# Patient Record
Sex: Male | Born: 1995 | Race: Black or African American | Hispanic: No | Marital: Single | Smoking: Never smoker
Health system: Southern US, Community
[De-identification: ages and names within clinical notes are randomized; demographics above are authoritative.]

---

## 2015-05-10 ENCOUNTER — Emergency Department (HOSPITAL_COMMUNITY)
Admission: EM | Admit: 2015-05-10 | Discharge: 2015-05-11 | Disposition: A | Discharge status: Home / Self Care | Payer: Medicaid Other | Attending: Emergency Medicine | Admitting: Emergency Medicine

## 2015-05-10 ENCOUNTER — Emergency Department (HOSPITAL_COMMUNITY): Payer: Medicaid Other

## 2015-05-10 ENCOUNTER — Encounter (HOSPITAL_COMMUNITY): Payer: Self-pay | Admitting: Emergency Medicine

## 2015-05-10 DIAGNOSIS — Y9351 Activity, roller skating (inline) and skateboarding: Secondary | ICD-10-CM | POA: Diagnosis not present

## 2015-05-10 DIAGNOSIS — Y9289 Other specified places as the place of occurrence of the external cause: Secondary | ICD-10-CM | POA: Insufficient documentation

## 2015-05-10 DIAGNOSIS — Y998 Other external cause status: Secondary | ICD-10-CM | POA: Diagnosis not present

## 2015-05-10 DIAGNOSIS — S93402A Sprain of unspecified ligament of left ankle, initial encounter: Secondary | ICD-10-CM | POA: Insufficient documentation

## 2015-05-10 DIAGNOSIS — S99912A Unspecified injury of left ankle, initial encounter: Secondary | ICD-10-CM | POA: Diagnosis present

## 2015-05-10 MED ORDER — IBUPROFEN 800 MG PO TABS: 800.0000 mg | ORAL_TABLET | Freq: Three times a day (TID) | ORAL | Status: AC

## 2015-05-10 MED ORDER — FENTANYL CITRATE (PF) 100 MCG/2ML IJ SOLN: 100.0000 ug | Freq: Once | INTRAMUSCULAR | Status: DC

## 2015-05-10 MED ORDER — FENTANYL CITRATE (PF) 100 MCG/2ML IJ SOLN
100.0000 ug | Freq: Once | INTRAMUSCULAR | Status: AC
  Administered 2015-05-10: 100 ug via INTRAVENOUS
  Filled 2015-05-10: qty 2

## 2015-05-10 NOTE — ED Notes (Signed)
Pt presents via EMS after skateboarding down 10+ stairs with friends.  He has an obvious deformity and severe swelling to the left ankle as well as a small abrasion on the outer aspect of the ankle due to being scraped on impact.  He denies pain at this time and states that there are no other injuries.  Very tender to palpation.

## 2015-05-10 NOTE — Discharge Instructions (Signed)

## 2015-05-10 NOTE — ED Notes (Signed)
Bed: XB28 Expected date:  Expected time:  Means of arrival:  Comments: 44M skateboarding fall/ obvious def to ankle

## 2015-05-10 NOTE — ED Provider Notes (Signed)
CSN: [id number]     Arrival date & time 05/10/15  2128 History   First MD Initiated Contact with Patient 05/10/15 2142     Chief Complaint  Patient presents with  . Ankle Injury     (Consider location/radiation/quality/duration/timing/severity/associated sxs/prior Treatment) HPI Comments: The patient is an 19 year old male who states that "I thought I could still skateboard". He decided to jump down 10 stairs on his skateboard, missed his skateboard on landing striking his left ankle and causing acute onset of pain and deformity. He was unable to ambulate afterwards but decided to go to a restaurant and try to get some food before his girlfriend forced him to come to the hospital to get evaluated. Because he could not walk he was transported by ambulance, ice pack added for deformity. Denies other injuries including numbness weakness back pain neck pain headache and there was no head injuries.  Patient is a 19 y.o. male presenting with lower extremity injury. The history is provided by the patient.  Ankle Injury    History reviewed. No pertinent past medical history. History reviewed. No pertinent past surgical history. History reviewed. No pertinent family history. Social History  Substance Use Topics  . Smoking status: Never Smoker   . Smokeless tobacco: None  . Alcohol Use: Yes     Comment: socially    Review of Systems  All other systems reviewed and are negative.     Allergies  Review of patient's allergies indicates no known allergies.  Home Medications   Prior to Admission medications   Medication Sig Start Date End Date Taking? Authorizing Provider  ibuprofen (ADVIL,MOTRIN) 800 MG tablet Take 1 tablet (800 mg total) by mouth 3 (three) times daily. 05/10/15   Eber Hong, MD   BP 135/92 mmHg  Pulse 70  Temp(Src) 98.8 F (37.1 C) (Oral)  Resp 16  Ht 6\' 1"  (1.854 m)  Wt 182 lb (82.555 kg)  BMI 24.02 kg/m2  SpO2 100% Physical Exam  Constitutional: He appears  well-developed and well-nourished. No distress.  HENT:  Head: Normocephalic and atraumatic.  Mouth/Throat: Oropharynx is clear and moist. No oropharyngeal exudate.  Eyes: Conjunctivae and EOM are normal. Pupils are equal, round, and reactive to light. Right eye exhibits no discharge. Left eye exhibits no discharge. No scleral icterus.  Neck: Normal range of motion. Neck supple. No JVD present. No thyromegaly present.  Cardiovascular: Normal rate, regular rhythm, normal heart sounds and intact distal pulses.  Exam reveals no gallop and no friction rub.   No murmur heard. Pulmonary/Chest: Effort normal and breath sounds normal. No respiratory distress. He has no wheezes. He has no rales.  Abdominal: Soft. Bowel sounds are normal. He exhibits no distension and no mass. There is no tenderness.  Musculoskeletal: He exhibits edema and tenderness.  Decreased range of motion of the left ankle, there is deformity especially laterally where there is tenderness over the lateral malleolus.  Lymphadenopathy:    He has no cervical adenopathy.  Neurological: He is alert. Coordination normal.  Skin: Skin is warm and dry. No rash noted. No erythema.  Psychiatric: He has a normal mood and affect. His behavior is normal.  Nursing note and vitals reviewed.   ED Course  Procedures (including critical care time) Labs Review Labs Reviewed - No data to display  Imaging Review Dg Ankle Complete Left  05/10/2015   CLINICAL DATA:  Injured skateboarding down 10+ stairs, obvious deformity and severe swelling LEFT ankle, tender to palpation, initial encounter  EXAM:  LEFT ANKLE COMPLETE - 3+ VIEW  COMPARISON:  None  FINDINGS: Soft tissue swelling greater anteriorly and laterally.  Osseous mineralization normal.  Joint spaces preserved.  No definite acute fracture, dislocation, or bone destruction.  IMPRESSION: Significant soft tissue swelling without acute osseous abnormalities.   Electronically Signed   By: Ulyses Southward  M.D.   On: 05/10/2015 22:58   Dg Foot Complete Left  05/10/2015   CLINICAL DATA:  Left foot and ankle pain after skateboarding injury. Deformity.  EXAM: LEFT FOOT - COMPLETE 3+ VIEW  COMPARISON:  None.  FINDINGS: No fracture or dislocation. The alignment and joint spaces are maintained. Small os peroneal. There is lateral soft tissue edema. No radiopaque foreign body.  IMPRESSION: Lateral soft tissue edema. No acute fracture or dislocation of the foot.   Electronically Signed   By: Rubye Oaks M.D.   On: 05/10/2015 22:59   I have personally reviewed and evaluated these images and lab results as part of my medical decision-making.    MDM   Final diagnoses:  Ankle sprain, left, initial encounter    Focal injury to left ankle, normal pulses at the foot, normal capillary refill, suspect fibular injury. Imaging ordered, pain medication ordered. There is a slight abrasion over the skin, this does not correlate with any underlying bony structure, he suspects this is an abrasion from the fall.  Imaging reveals no signs of fracture, I have personally looked at the x-ray, there is no sign of fracture  I have personally viewed and interpreted the imaging and agree with radiologist interpretation.  Meds given in ED:  Medications  fentaNYL (SUBLIMAZE) injection 100 mcg (100 mcg Intravenous Given 05/10/15 2202)    New Prescriptions   IBUPROFEN (ADVIL,MOTRIN) 800 MG TABLET    Take 1 tablet (800 mg total) by mouth 3 (three) times daily.      Eber Hong, MD 05/10/15 2330

## 2017-01-15 IMAGING — CR DG ANKLE COMPLETE 3+V*L*
3 series · 3 of 3 positions shown · non-contrast
Comparison: None

CLINICAL DATA: Injured skateboarding down 10+ stairs, obvious
deformity and severe swelling LEFT ankle, tender to palpation,
initial encounter

EXAM:
LEFT ANKLE COMPLETE - 3+ VIEW

[x ankle ap left]
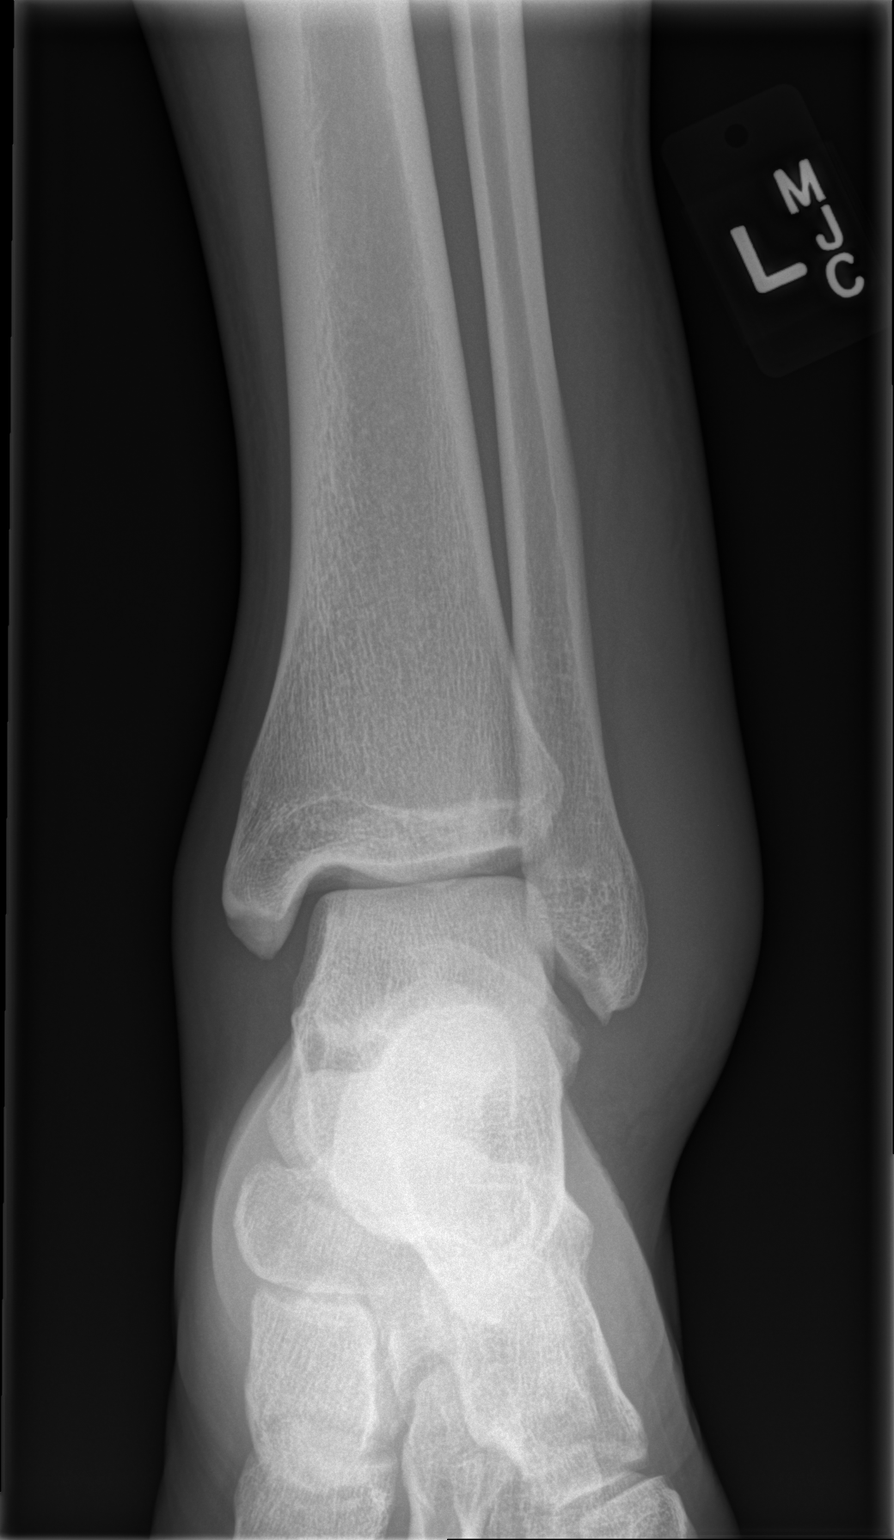

[x ankle obl left]
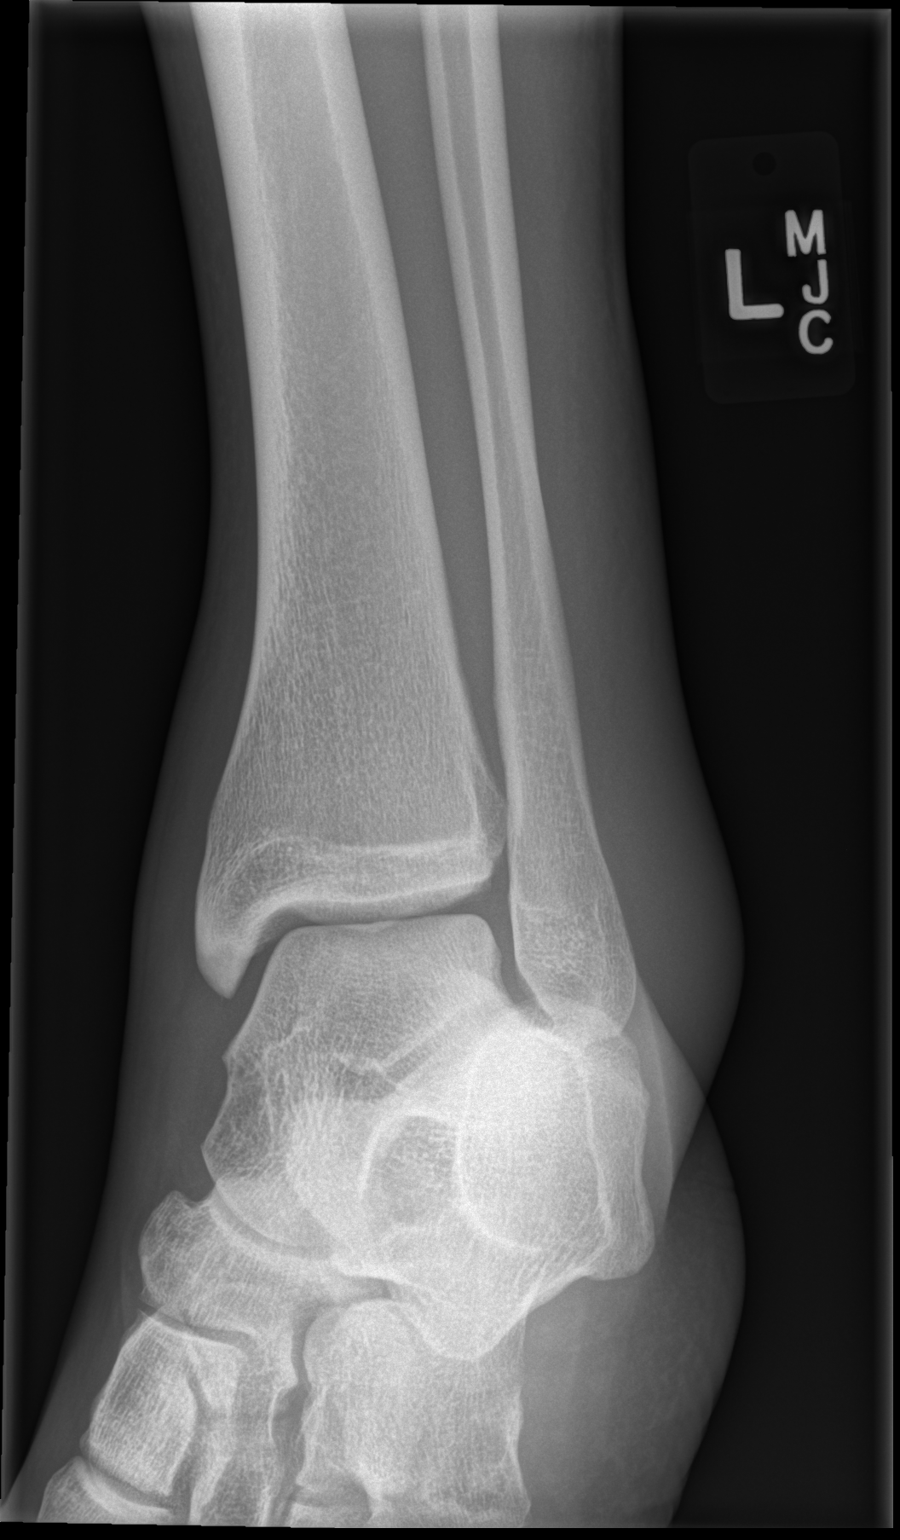

[x ankle lat left]
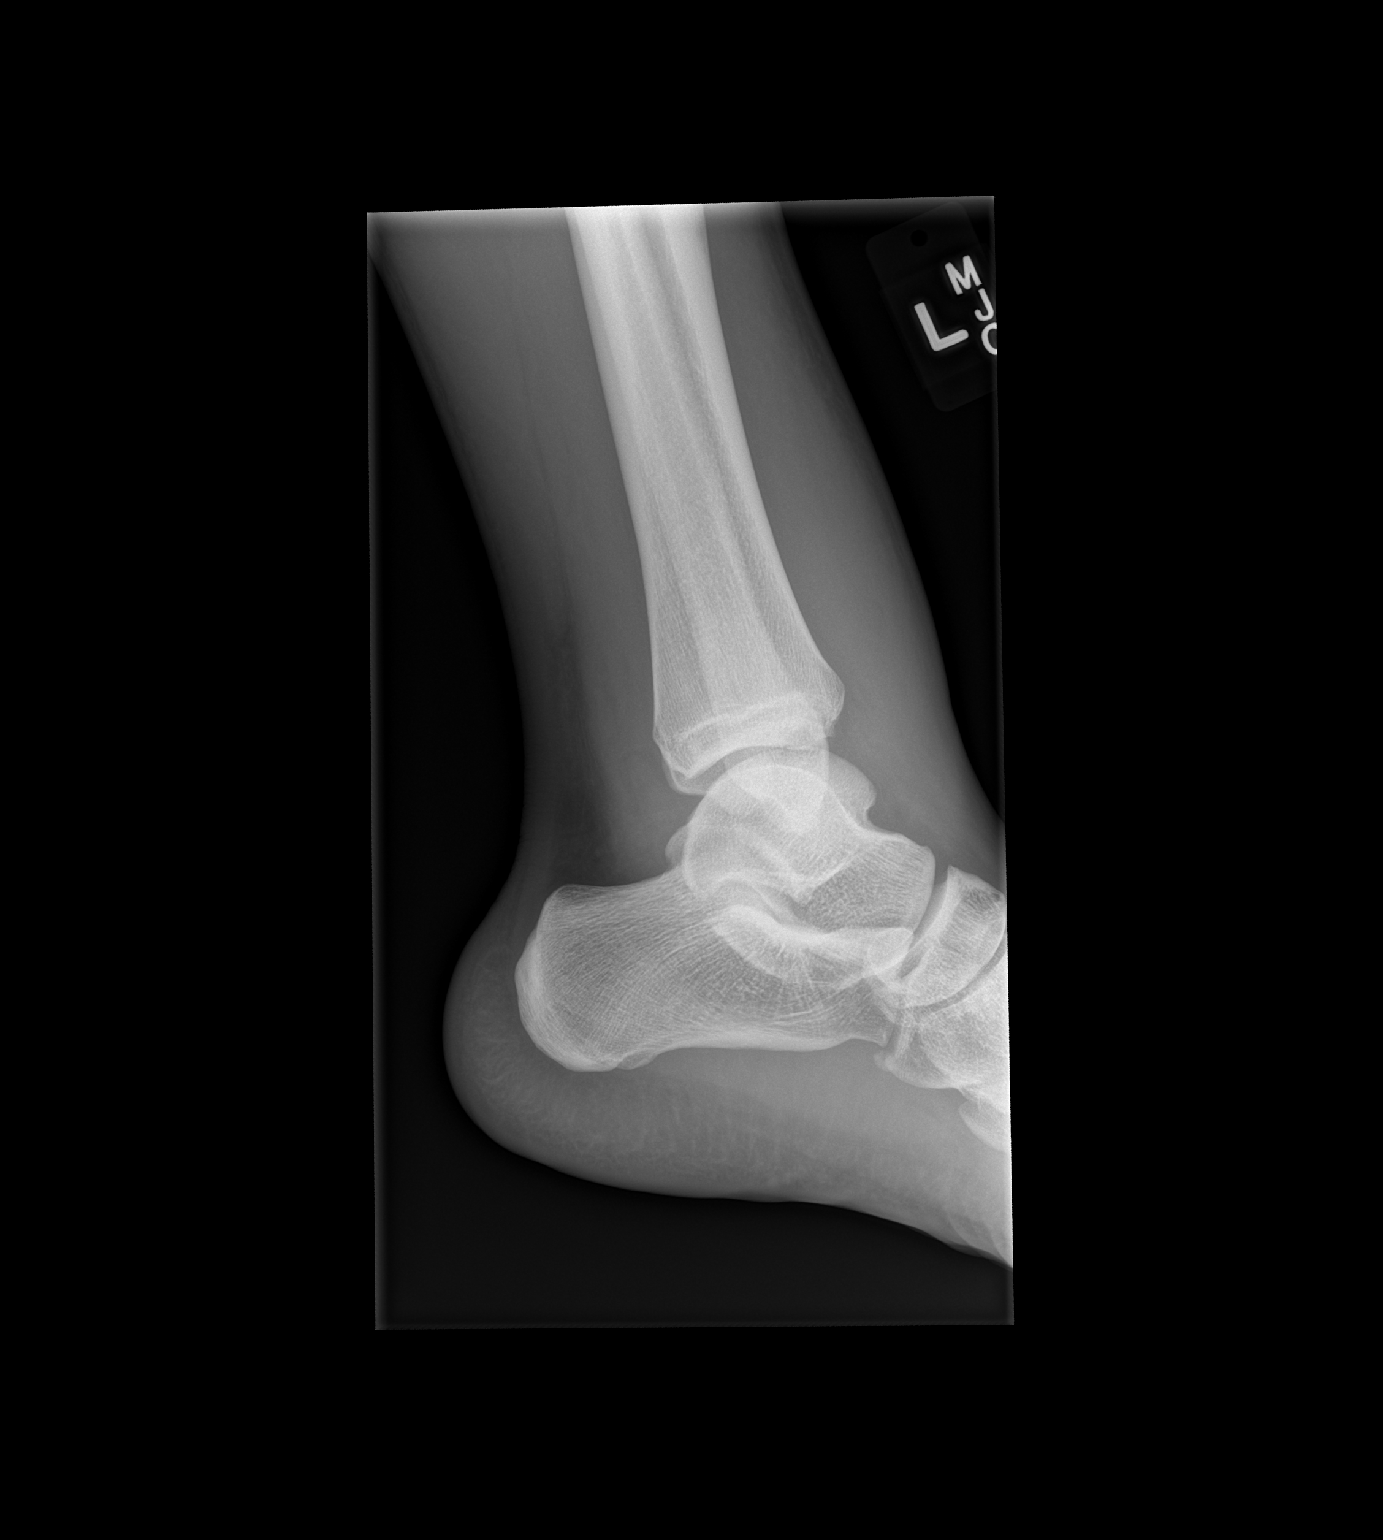

[3 of 3 positions shown; findings below may reference images not displayed]

FINDINGS: Soft tissue swelling greater anteriorly and laterally.

Osseous mineralization normal.

Joint spaces preserved.

No definite acute fracture, dislocation, or bone destruction.
IMPRESSION: Significant soft tissue swelling without acute osseous
abnormalities.

## 2017-01-15 IMAGING — CR DG FOOT COMPLETE 3+V*L*
3 series · 3 of 3 positions shown · non-contrast
Comparison: None.

CLINICAL DATA: Left foot and ankle pain after skateboarding injury.
Deformity.

EXAM:
LEFT FOOT - COMPLETE 3+ VIEW

[x foot lat left]
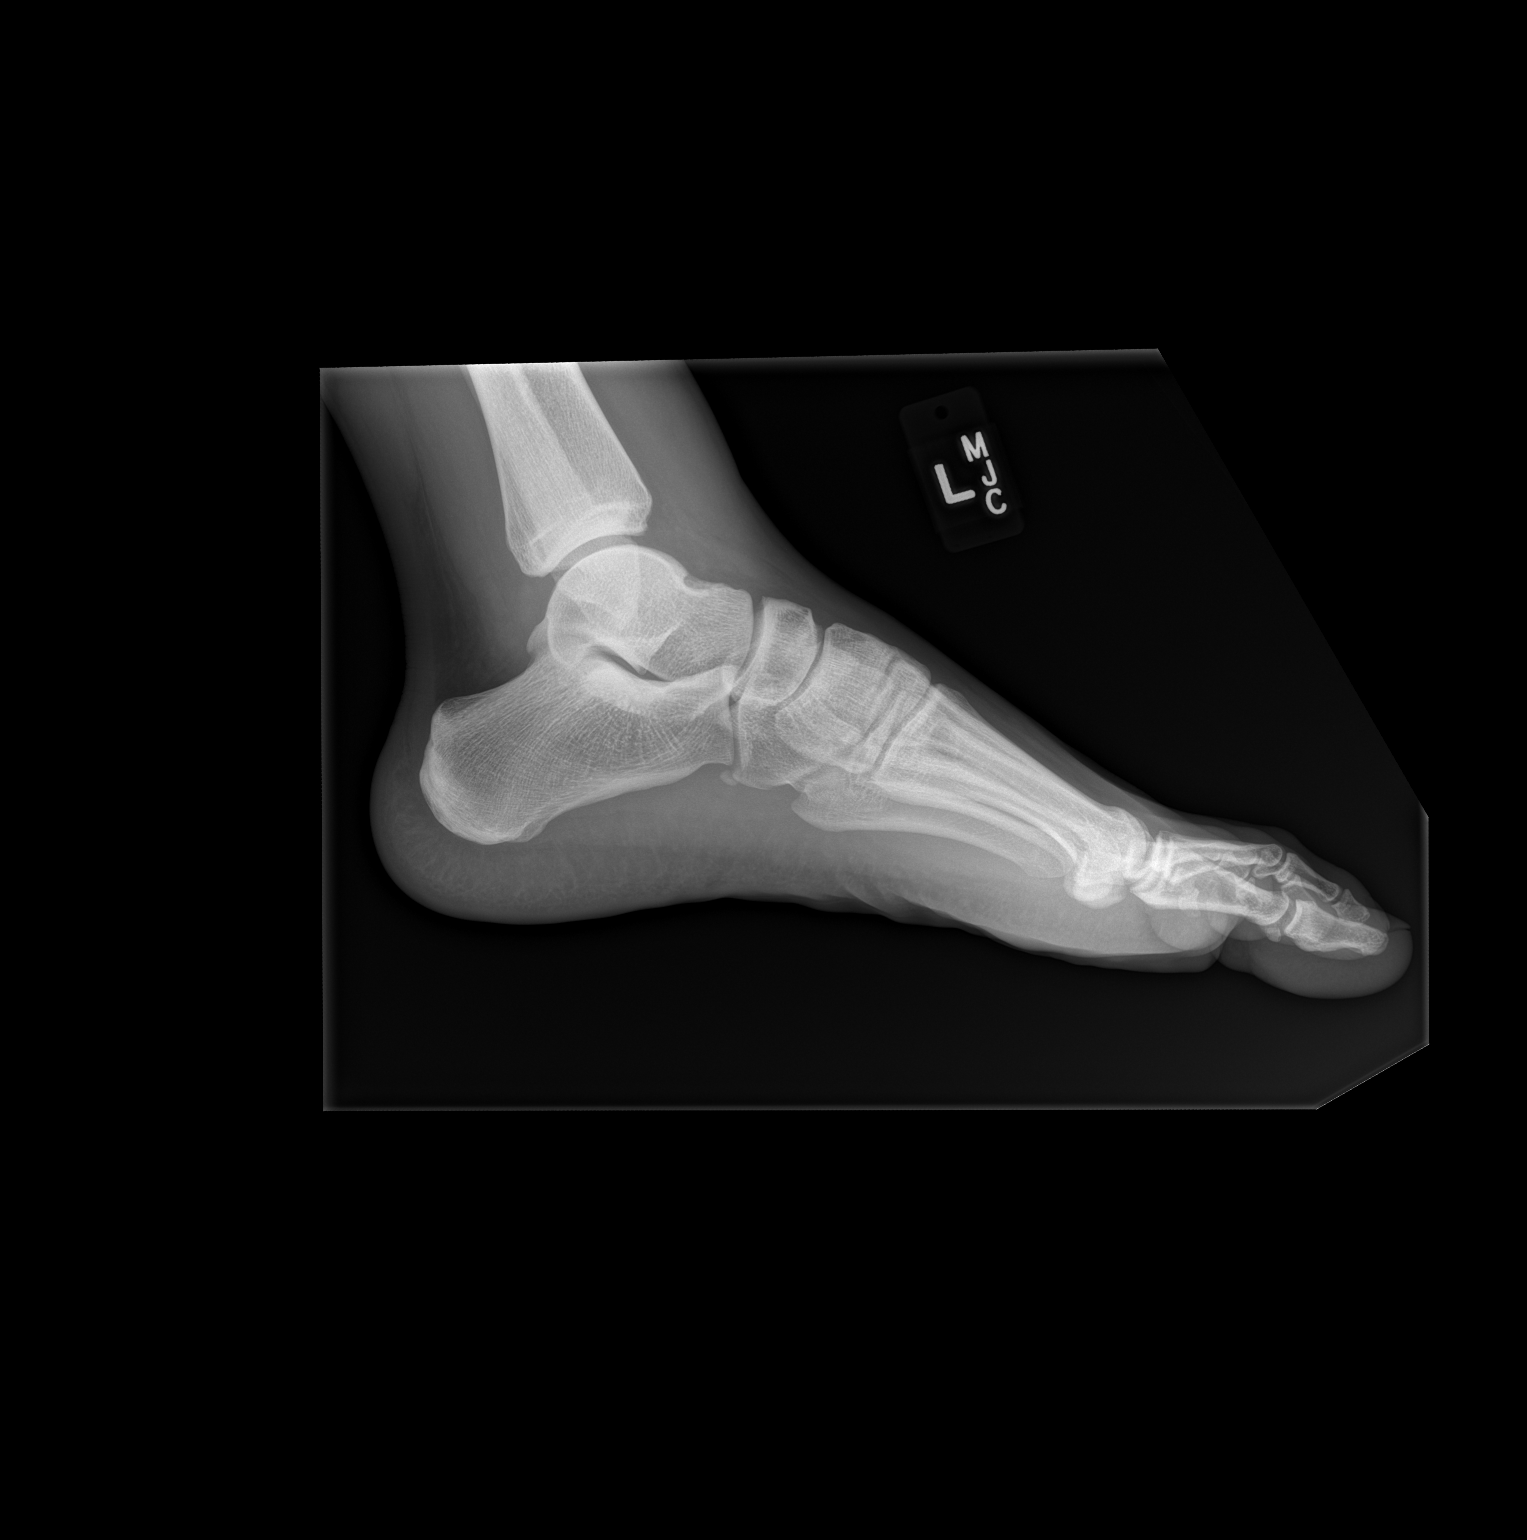

[x foot ap left]
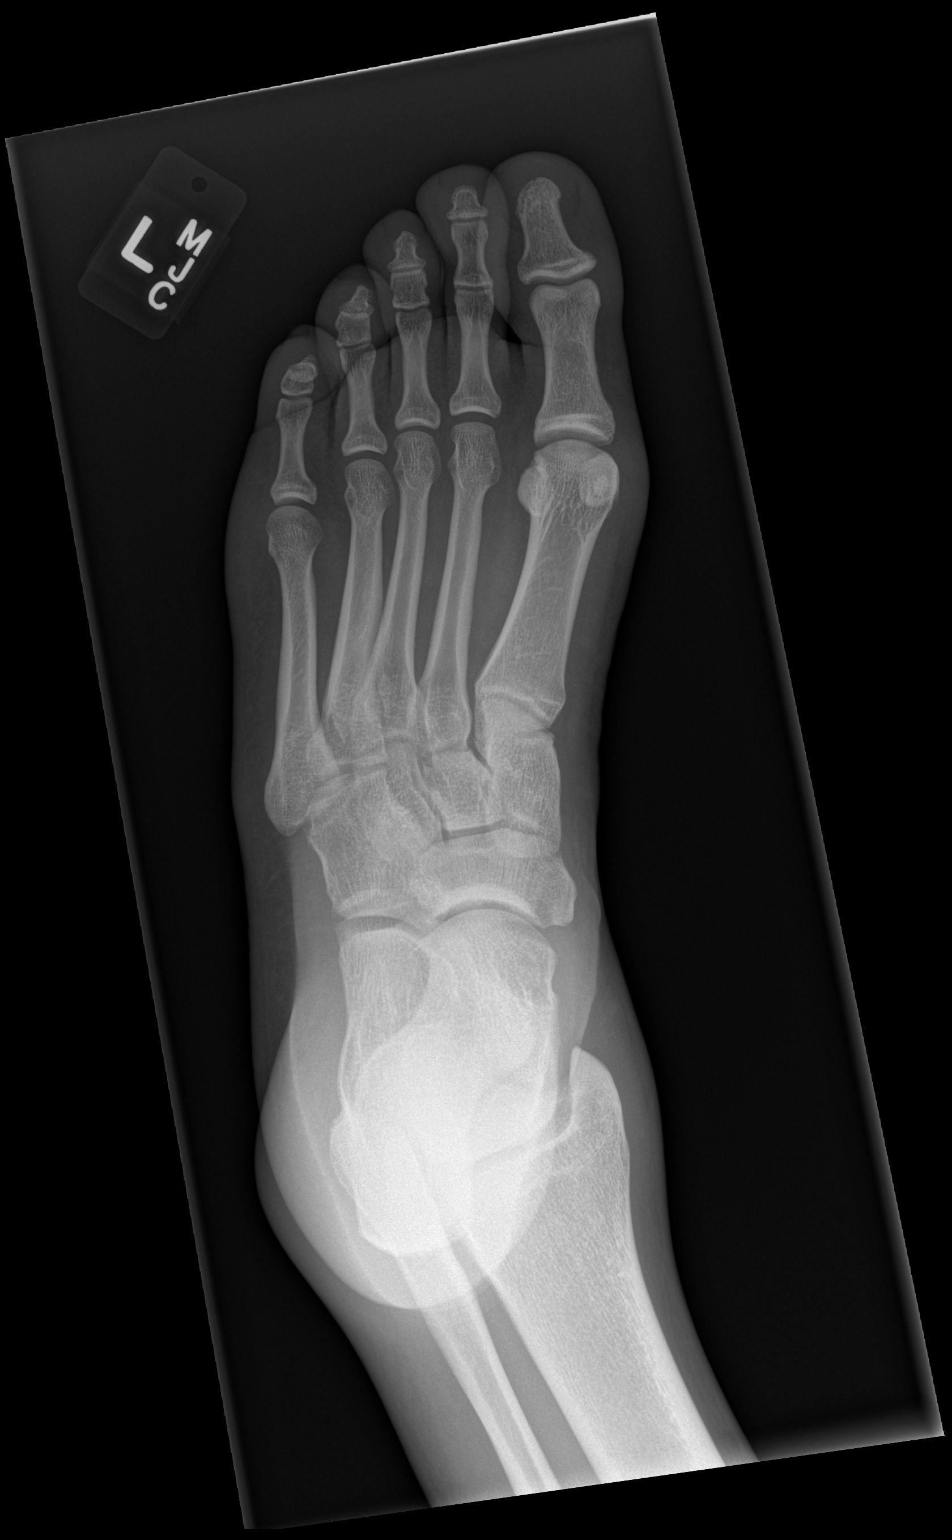

[x foot obl left]
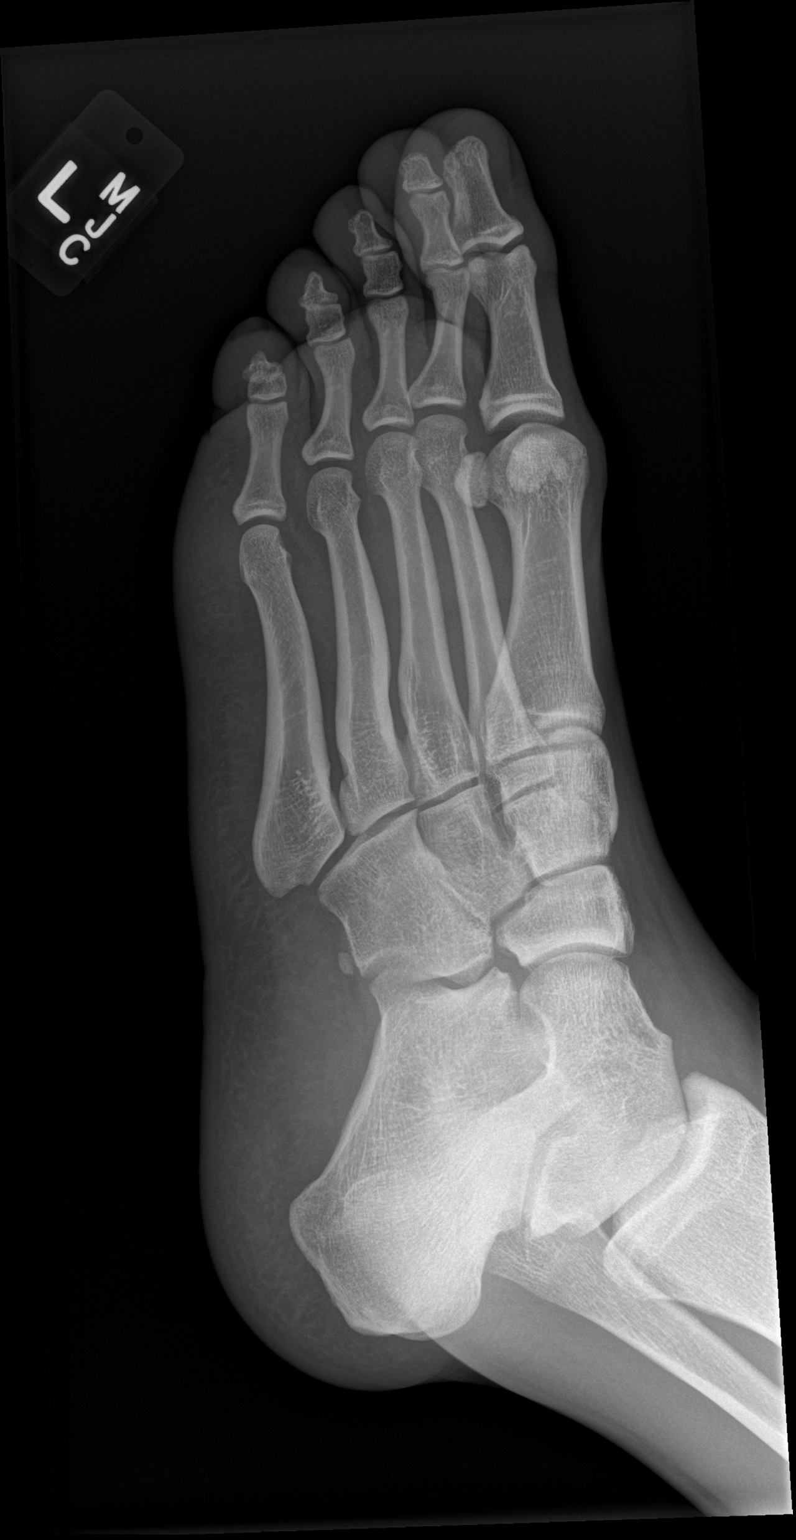

[3 of 3 positions shown; findings below may reference images not displayed]

FINDINGS: No fracture or dislocation. The alignment and joint spaces are
maintained. Small os peroneal. There is lateral soft tissue edema.
No radiopaque foreign body.
IMPRESSION: Lateral soft tissue edema. No acute fracture or dislocation of the
foot.

## 2017-09-24 ENCOUNTER — Encounter (HOSPITAL_COMMUNITY): Payer: Self-pay

## 2017-09-24 ENCOUNTER — Emergency Department (HOSPITAL_COMMUNITY): Payer: Self-pay

## 2017-09-24 ENCOUNTER — Emergency Department (HOSPITAL_COMMUNITY)
Admission: EM | Admit: 2017-09-24 | Discharge: 2017-09-24 | Disposition: A | Discharge status: Home / Self Care | Payer: Self-pay | Attending: Emergency Medicine | Admitting: Emergency Medicine

## 2017-09-24 ENCOUNTER — Encounter (HOSPITAL_COMMUNITY): Payer: Self-pay | Admitting: Emergency Medicine

## 2017-09-24 DIAGNOSIS — M25469 Effusion, unspecified knee: Secondary | ICD-10-CM

## 2017-09-24 DIAGNOSIS — M25562 Pain in left knee: Secondary | ICD-10-CM | POA: Insufficient documentation

## 2017-09-24 DIAGNOSIS — M25462 Effusion, left knee: Secondary | ICD-10-CM | POA: Insufficient documentation

## 2017-09-24 MED ORDER — NAPROXEN 500 MG PO TABS: 500.0000 mg | ORAL_TABLET | Freq: Two times a day (BID) | ORAL | 0 refills | Status: AC

## 2017-09-24 NOTE — ED Provider Notes (Signed)
[facility]-EMERGENCY DEPT Provider Note   CSN: [id number] Arrival date & time: 09/24/17  0243     History   Chief Complaint Chief Complaint  Patient presents with  . Knee Pain    HPI Evan Hardin is a 22 y.o. male.  The history is provided by the patient and medical records.  Knee Pain       22 year old male presenting to the ED with 1 week of left knee pain.  He does not recall any specific injury other than accidentally hitting his knee on a box beneath his desk at work.  States he does have a lot of pain, especially when walking.  Throughout the day knee seems to swell up, but improves once he is at home and resting.  He denies any numbness or weakness of his left leg.  No prior left knee injuries or surgeries.  Has been applying ice at home to help with swelling.  History reviewed. No pertinent past medical history.  There are no active problems to display for this patient.   History reviewed. No pertinent surgical history.     Home Medications    Prior to Admission medications   Not on File    Family History History reviewed. No pertinent family history.  Social History Social History   Tobacco Use  . Smoking status: Never Smoker  . Smokeless tobacco: Never Used  Substance Use Topics  . Alcohol use: No    Frequency: Never  . Drug use: No     Allergies   Patient has no allergy information on record.   Review of Systems Review of Systems  Musculoskeletal: Positive for arthralgias.  All other systems reviewed and are negative.    Physical Exam Updated Vital Signs BP (!) 148/88 (BP Location: Right Arm)   Pulse 64   Temp 98.3 F (36.8 C) (Oral)   Resp 16   SpO2 99%   Physical Exam  Constitutional: He is oriented to person, place, and time. He appears well-developed and well-nourished.  HENT:  Head: Normocephalic and atraumatic.  Mouth/Throat: Oropharynx is clear and moist.  Eyes: Conjunctivae and EOM  are normal. Pupils are equal, round, and reactive to light.  Neck: Normal range of motion.  Cardiovascular: Normal rate, regular rhythm and normal heart sounds.  Pulmonary/Chest: Effort normal and breath sounds normal.  Abdominal: Soft. Bowel sounds are normal.  Musculoskeletal: Normal range of motion.       Legs: Left knee with noted effusion and swelling; tenderness along the lateral edge of the superior patella; there does appear to be a small defect in this area compared with right leg; patella tracks normally during ROM testing; normal contractions of quad and gastroc muscles; DP pulse intact, moving toes normally, ambulatory with limping gait favoring left leg  Neurological: He is alert and oriented to person, place, and time.  Skin: Skin is warm and dry.  Psychiatric: He has a normal mood and affect.  Nursing note and vitals reviewed.    ED Treatments / Results  Labs (all labs ordered are listed, but only abnormal results are displayed) Labs Reviewed - No data to display  EKG  EKG Interpretation None       Radiology Dg Knee Complete 4 Views Left  Result Date: 09/24/2017 CLINICAL DATA:  22 year old male with left knee pain no known injury. EXAM: LEFT KNEE - COMPLETE 4+ VIEW COMPARISON:  None. FINDINGS: There is focal indentation of the superolateral cortex of the patella with small  adjacent bony fragments, age indeterminate, likely chronic. Clinical correlation is recommended. No definite acute fracture. There is no dislocation. Trace suprapatellar effusion may be present. The soft tissues are unremarkable. IMPRESSION: 1. No definite acute fracture or dislocation. Focal indentation of the superolateral patella, likely chronic. Clinical correlation is recommended. 2. Probable trace suprapatellar effusion. Electronically Signed   By: Elgie CollardArash  Radparvar M.D.   On: 09/24/2017 04:18    Procedures Procedures (including critical care time)  Medications Ordered in ED Medications - No  data to display   Initial Impression / Assessment and Plan / ED Course  I have reviewed the triage vital signs and the nursing notes.  Pertinent labs & imaging results that were available during my care of the patient were reviewed by me and considered in my medical decision making (see chart for details).  22 year old male here with left knee pain.  Only reported injury is excellently striking his knee against a box beneath his desk at work.  Has been limping for about the past week.  Intermittent swelling, worse with weightbearing and ambulation.  On exam he does have tenderness along the lateral edge of superior patella.  There does appear to be a small defect as well as effusion noted.  Patella tracks normally.  Normal muscle contraction of the quadriceps and gastrocnemius.  Leg is neurovascularly intact.  X-ray obtained revealing focal indentation of the superior lateral cortex of the patella with some bony fragments-- age-indeterminate.  Given his exam findings, suspect this is acute.  Will place in knee immobilizer, crutches.  RICE routine, NSAIDs.  Close follow-up with orthopedics.  Discussed plan with patient, he acknowledged understanding and agreed with plan of care.  Return precautions given for new or worsening symptoms.  Final Clinical Impressions(s) / ED Diagnoses   Final diagnoses:  Acute pain of left knee  Suprapatellar effusion of knee    ED Discharge Orders        Ordered    naproxen (NAPROSYN) 500 MG tablet  2 times daily with meals     09/24/17 0457       Garlon HatchetSanders, Lisa M, PA-C 09/24/17 0523    Ward, Layla MawKristen N, DO 09/24/17 [phone]

## 2017-09-24 NOTE — Discharge Instructions (Signed)
Take the prescribed medication as directed. Follow-up with orthopedics-- call for appt in the morning. Return to the ED for new or worsening symptoms.

## 2017-09-24 NOTE — ED Triage Notes (Signed)
Pt complains of left knee pain and swelling all week, no injury noted

## 2017-09-24 NOTE — ED Notes (Signed)
Bed: WTR7 Expected date:  Expected time:  Means of arrival:  Comments:
